# Patient Record
Sex: Male | Born: 1980 | Hispanic: Yes | Smoking: Never smoker
Health system: Southern US, Community
[De-identification: ages and names within clinical notes are randomized; demographics above are authoritative.]

## PROBLEM LIST (undated history)

## (undated) HISTORY — PX: NO PAST SURGERIES: SHX2092

---

## 2011-12-04 ENCOUNTER — Other Ambulatory Visit: Payer: Self-pay | Admitting: Geriatric Medicine

## 2011-12-04 DIAGNOSIS — G8929 Other chronic pain: Secondary | ICD-10-CM

## 2011-12-04 DIAGNOSIS — R109 Unspecified abdominal pain: Secondary | ICD-10-CM

## 2011-12-06 ENCOUNTER — Other Ambulatory Visit: Payer: Self-pay

## 2011-12-06 ENCOUNTER — Ambulatory Visit
Admission: RE | Admit: 2011-12-06 | Discharge: 2011-12-06 | Disposition: A | Discharge status: Home / Self Care | Payer: Self-pay | Source: Ambulatory Visit | Attending: Geriatric Medicine | Admitting: Geriatric Medicine

## 2011-12-06 DIAGNOSIS — R109 Unspecified abdominal pain: Secondary | ICD-10-CM

## 2011-12-06 DIAGNOSIS — R102 Pelvic and perineal pain: Secondary | ICD-10-CM

## 2011-12-06 DIAGNOSIS — G8929 Other chronic pain: Secondary | ICD-10-CM

## 2013-04-30 IMAGING — US US ABDOMEN COMPLETE
1 series · 14 of 25 positions shown · non-contrast
Comparison: None.

CLINICAL DATA: Chronic abdominal and pelvic pain for several years

COMPLETE ABDOMINAL ULTRASOUND

[Series 1: us abdomen complete · 0.28mm/px · 14 of 58 slices shown]
[im 1/58]
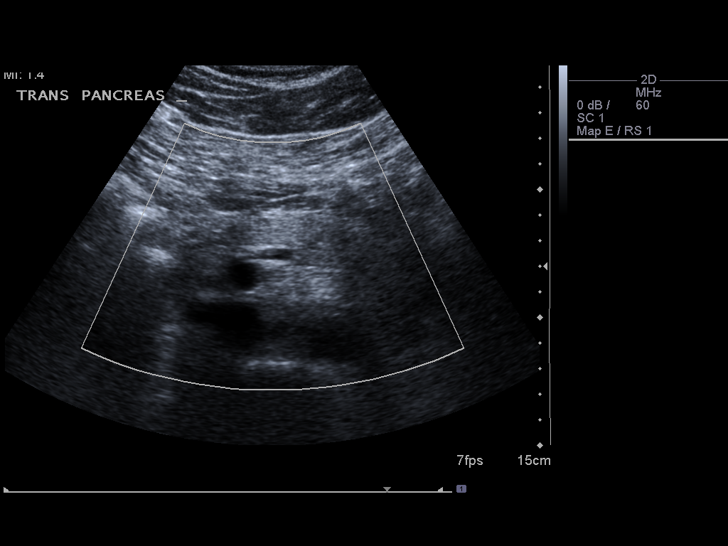
[im 5/58]
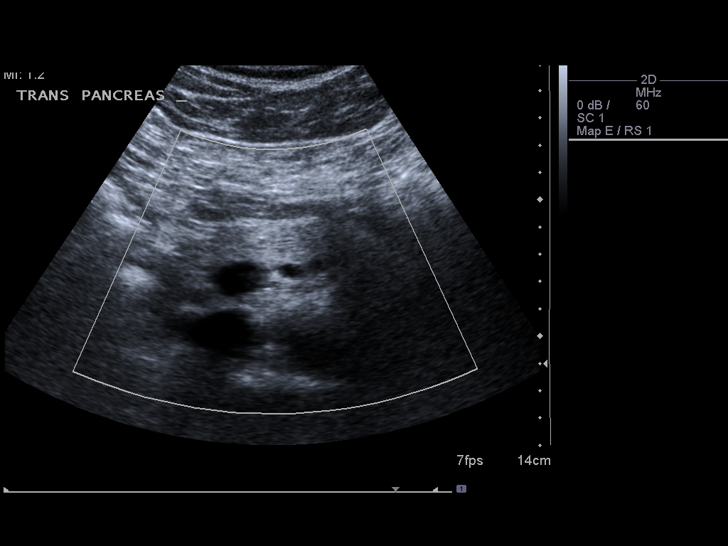
[im 10/58]
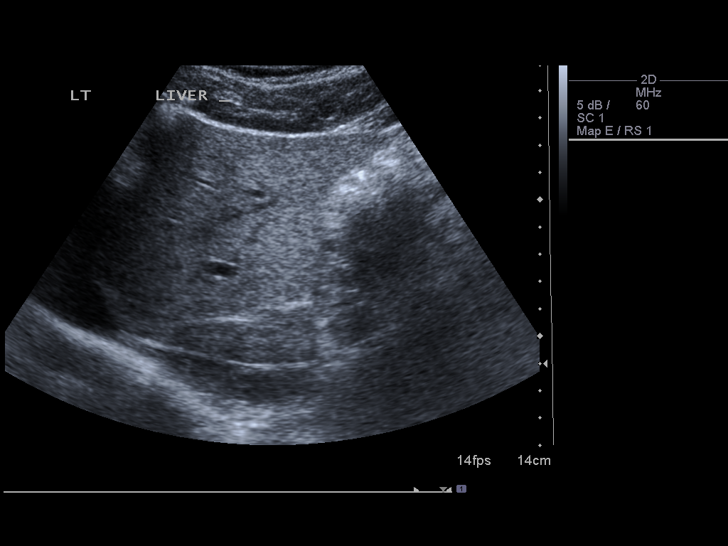
[im 15/58]
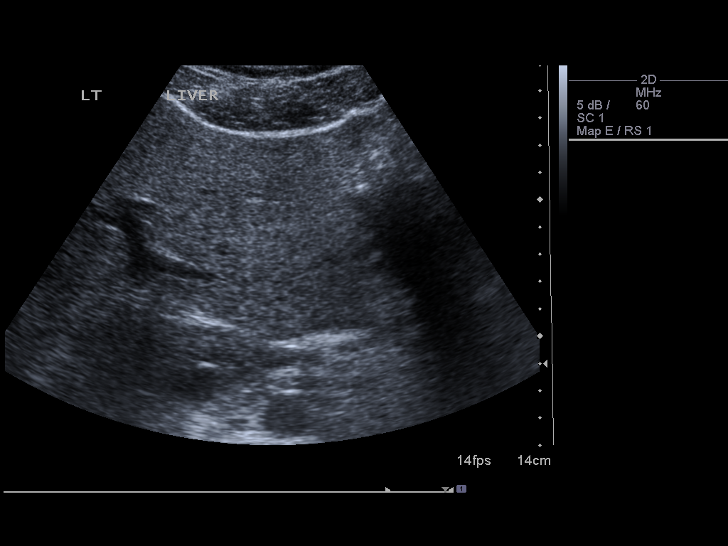
[im 20/58]
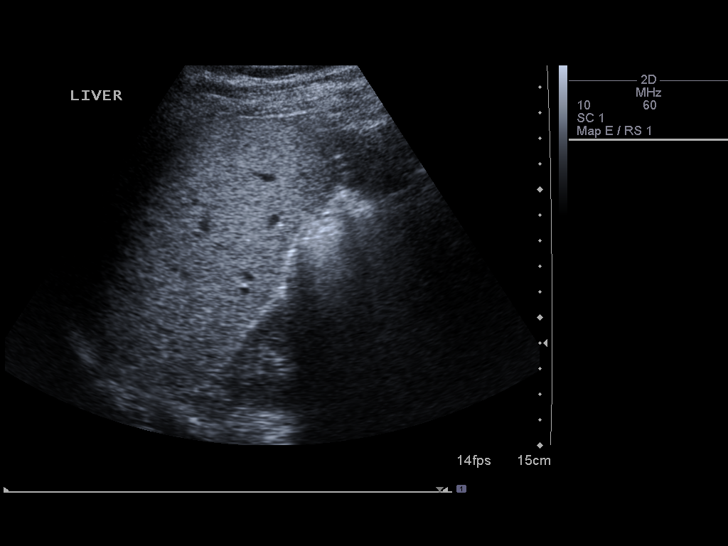
[im 22/58]
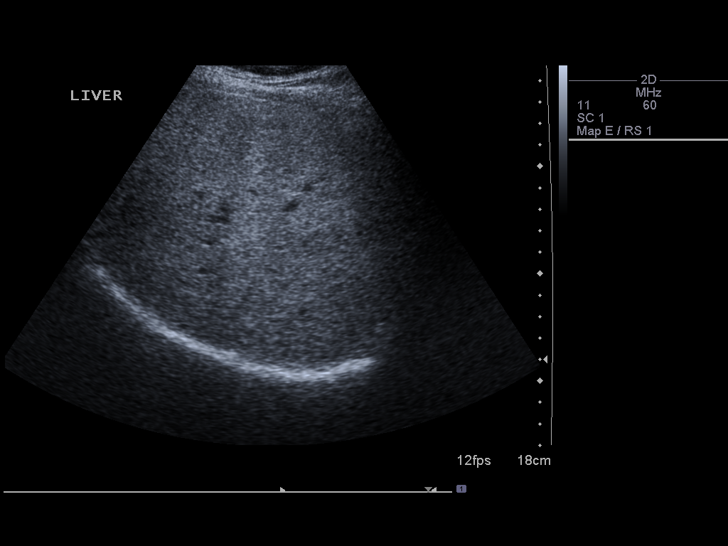
[im 27/58]
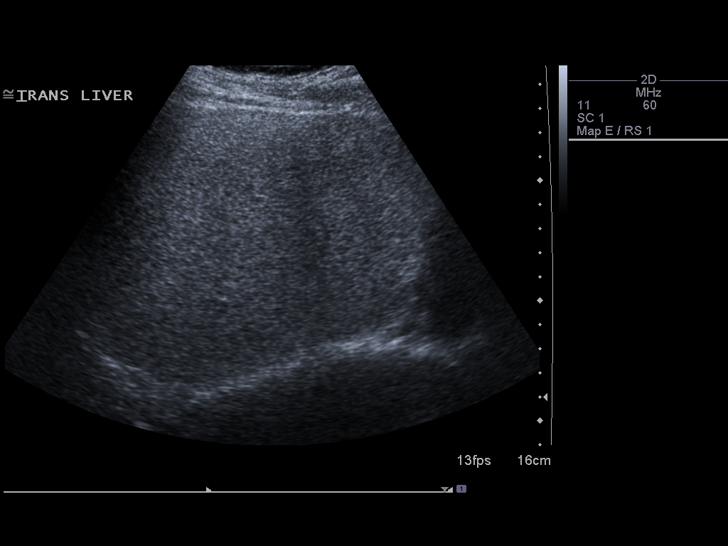
[im 31/58]
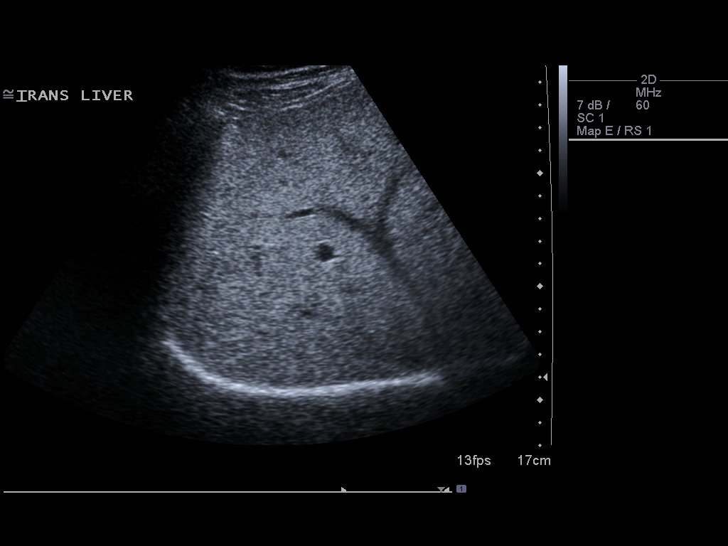
[im 36/58]
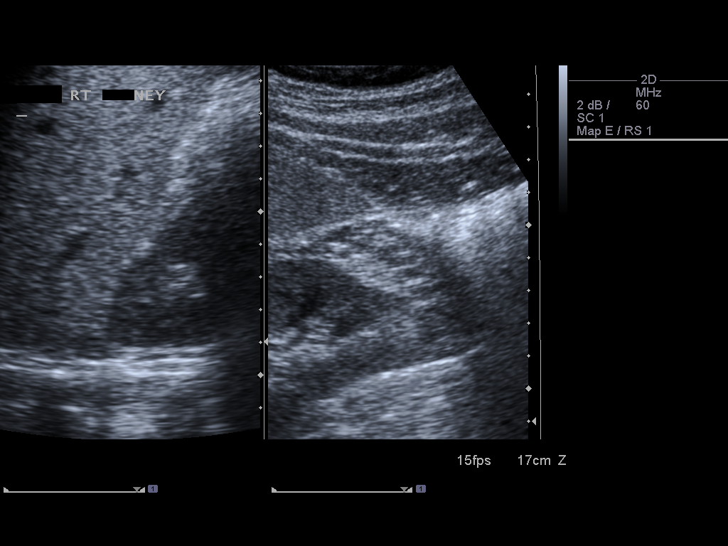
[im 39/58]
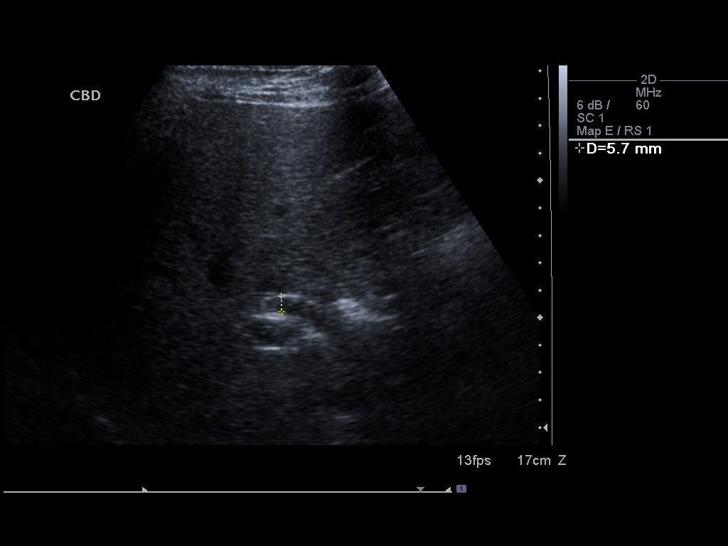
[im 43/58]
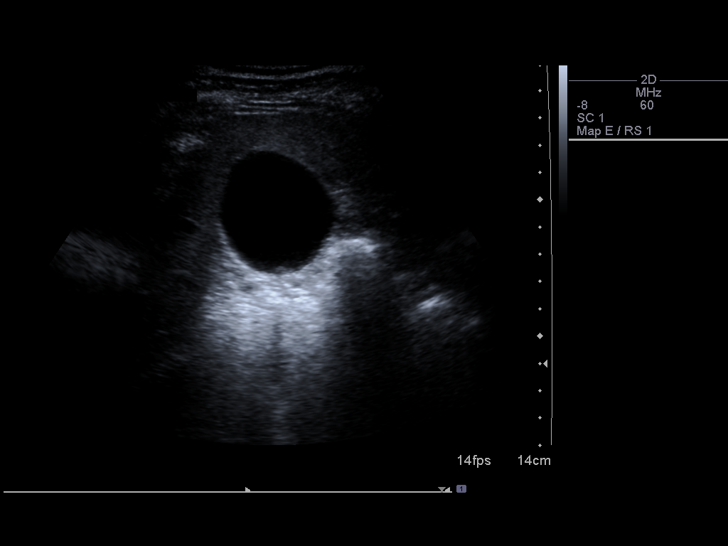
[im 48/58]
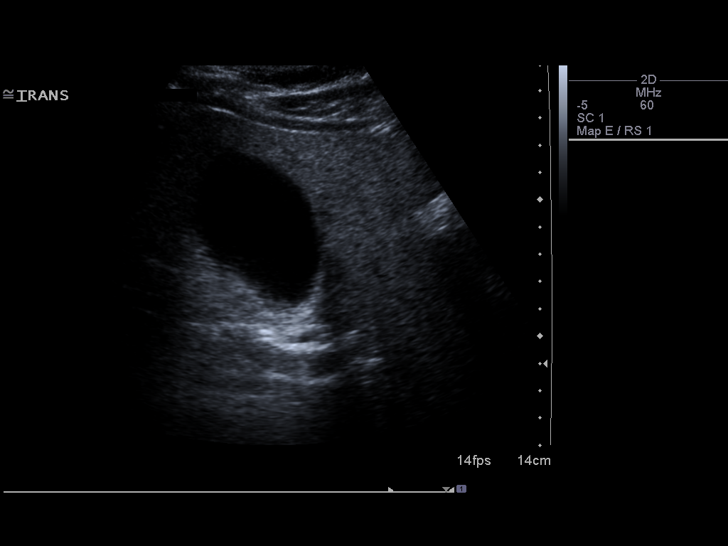
[im 53/58]
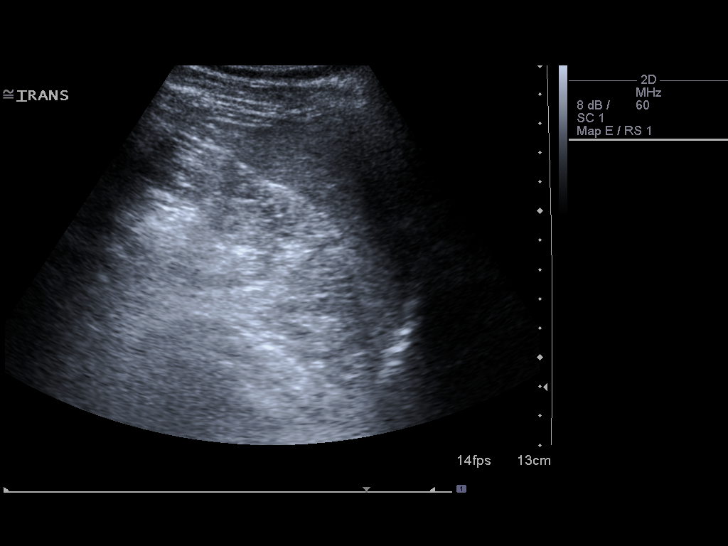
[im 58/58]
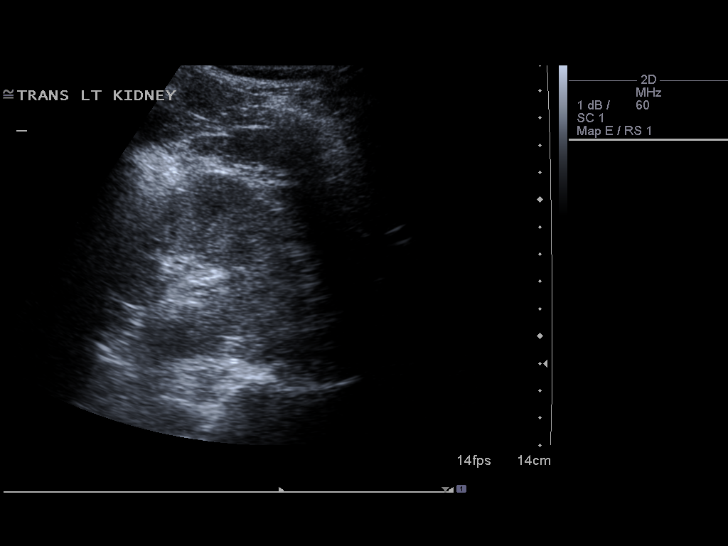

[14 of 25 positions shown; findings below may reference images not displayed]

FINDINGS: Gallbladder:  The gallbladder is visualized and no gallstones are
noted.  There is no pain over the gallbladder with compression.

Common bile duct:  The common bile duct is normal measuring

Liver:  The liver is diffusely echogenic consistent with fatty
infiltration.  No focal abnormality is seen.

IVC:  The IVC is unremarkable.

Pancreas:  The pancreas is moderately well seen with no abnormality
noted.  The pancreatic duct is not dilated.

Spleen:  The spleen is normal measuring 6.3 cm sagittally.

Right Kidney:  No hydronephrosis is seen.  The right kidney
measures 12.1 cm sagittally.

Left Kidney:  No hydronephrosis.  The left kidney measures 10.6 cm.

Abdominal aorta:  The abdominal aorta is normal in caliber.
IMPRESSION: 1.  Diffusely echogenic liver consistent with fatty infiltration.
No focal abnormality.
2.  No gallstones.

## 2020-08-09 ENCOUNTER — Encounter: Payer: Self-pay | Admitting: Nurse Practitioner

## 2020-08-12 ENCOUNTER — Other Ambulatory Visit (INDEPENDENT_AMBULATORY_CARE_PROVIDER_SITE_OTHER): Payer: Self-pay

## 2020-08-12 ENCOUNTER — Ambulatory Visit (INDEPENDENT_AMBULATORY_CARE_PROVIDER_SITE_OTHER): Payer: Self-pay | Admitting: Nurse Practitioner

## 2020-08-12 ENCOUNTER — Encounter: Payer: Self-pay | Admitting: Nurse Practitioner

## 2020-08-12 VITALS — BP 108/74 | HR 77 | Ht 67.0 in | Wt 187.0 lb

## 2020-08-12 DIAGNOSIS — R1033 Periumbilical pain: Secondary | ICD-10-CM

## 2020-08-12 DIAGNOSIS — R1084 Generalized abdominal pain: Secondary | ICD-10-CM | POA: Insufficient documentation

## 2020-08-12 DIAGNOSIS — R1030 Lower abdominal pain, unspecified: Secondary | ICD-10-CM

## 2020-08-12 DIAGNOSIS — R34 Anuria and oliguria: Secondary | ICD-10-CM

## 2020-08-12 DIAGNOSIS — K5900 Constipation, unspecified: Secondary | ICD-10-CM | POA: Insufficient documentation

## 2020-08-12 LAB — COMPREHENSIVE METABOLIC PANEL
ALT: 60 U/L — ABNORMAL HIGH (ref 0–53)
AST: 29 U/L (ref 0–37)
Albumin: 5 g/dL (ref 3.5–5.2)
Alkaline Phosphatase: 82 U/L (ref 39–117)
BUN: 16 mg/dL (ref 6–23)
CO2: 29 mEq/L (ref 19–32)
Calcium: 9.6 mg/dL (ref 8.4–10.5)
Chloride: 104 mEq/L (ref 96–112)
Creatinine, Ser: 1.09 mg/dL (ref 0.40–1.50)
GFR: 75.13 mL/min (ref >60.00)
Glucose, Bld: 87 mg/dL (ref 70–99)
Potassium: 4.1 mEq/L (ref 3.5–5.1)
Sodium: 138 mEq/L (ref 135–145)
Total Bilirubin: 0.3 mg/dL (ref 0.2–1.2)
Total Protein: 7.8 g/dL (ref 6.0–8.3)

## 2020-08-12 LAB — URINALYSIS
Bilirubin Urine: NEGATIVE
Hgb urine dipstick: NEGATIVE
Ketones, ur: NEGATIVE
Leukocytes,Ua: NEGATIVE
Nitrite: NEGATIVE
Specific Gravity, Urine: >=1.03 — AB (ref 1.000–1.030)
Total Protein, Urine: NEGATIVE
Urine Glucose: NEGATIVE
Urobilinogen, UA: 0.2 (ref 0.0–1.0)
pH: 5.5 (ref 5.0–8.0)

## 2020-08-12 LAB — CBC WITH DIFFERENTIAL/PLATELET
Basophils Absolute: 0 10*3/uL (ref 0.0–0.1)
Basophils Relative: 0.6 % (ref 0.0–3.0)
Eosinophils Absolute: 0.1 10*3/uL (ref 0.0–0.7)
Eosinophils Relative: 2.7 % (ref 0.0–5.0)
HCT: 41.6 % (ref 39.0–52.0)
Hemoglobin: 14.6 g/dL (ref 13.0–17.0)
Lymphocytes Relative: 46.4 % — ABNORMAL HIGH (ref 12.0–46.0)
Lymphs Abs: 2.4 10*3/uL (ref 0.7–4.0)
MCHC: 35.2 g/dL (ref 30.0–36.0)
MCV: 83.2 fl (ref 78.0–100.0)
Monocytes Absolute: 0.5 10*3/uL (ref 0.1–1.0)
Monocytes Relative: 9 % (ref 3.0–12.0)
Neutro Abs: 2.1 10*3/uL (ref 1.4–7.7)
Neutrophils Relative %: 41.3 % — ABNORMAL LOW (ref 43.0–77.0)
Platelets: 205 10*3/uL (ref 150.0–400.0)
RBC: 4.99 Mil/uL (ref 4.22–5.81)
RDW: 13.1 % (ref 11.5–15.5)
WBC: 5.1 10*3/uL (ref 4.0–10.5)

## 2020-08-12 NOTE — Progress Notes (Signed)
08/12/2020 Randall Mcknight [id number] 12-Jan-1981   CHIEF COMPLAINT: Constipation, abdominal pain   HISTORY OF PRESENT ILLNESS:  Randall Mcknight is a 39 year old male with a past medical history of  hypercholesterolemia and constipation. No past surgical history. He presents today for further evaluation for constipation and abdominal pain which have progressively worsened over the past year. He is accompanied by a [facility] interpreter as he speaks [place], limited [place].  He complains of having periumbilical/ lower abdominal pain which started about 1 year ago and has progressively worsened over the past 2 months.  He has associated constipation.  He describes passing a small amount of soft brown formed stool once or twice daily but does not feel emptied.  He denies having any rectal bleeding or melena.  He reports having intermittent hemorrhoidal irritation but no bleeding.  No rectal pain.  He has difficulty urinating.  He describes feeling pressure in his bladder and his urine flow has been decreased for several years.  After he passes a bowel movement he urinates a small amount with dysuria.  No hematuria.  He stated his abdominal pain is constant but does not awaken him from sleep.  However, he has anxiety and worries significantly regarding the cause of abdominal pain which interferes with his ability to sleep well.  No fever, sweats or chills.  No weight loss.  No nausea or vomiting.  No dysphagia or heartburn.  Records from his document he underwent an EGD 06/23/2013 which was normal.  Negative H. pylori stool antigen 08/2016.  He was seen by his PCP and he was prescribed MiraLAX which he took for 1 week without significant improvement.  He also tried Dicyclomine 20 mg p.o. as needed which did not relieve his abdominal pain.  No family history of esophageal, gastric, colon cancer or IBD.  He is currently uninsured.  We have provided the patient with [employer] financial assistant  forms.   Social History: He is single.  He has 1 son and 2 daughters.  He works in [employer].  He is a non-smoker.  No alcohol use.  No drug use.  Family History:  Mother age 75 healthy. Father 29 healthy. No family history of esophageal, gastric or colon cancer.  Grandparents with diabetes.  No Known Allergies    No outpatient encounter medications on file as of 08/12/2020.   No facility-administered encounter medications on file as of 08/12/2020.     REVIEW OF SYSTEMS:  Gen: Denies fever, sweats or chills. No weight loss.  CV: Denies chest pain, palpitations or edema. Resp: Denies cough, shortness of breath of hemoptysis.  GI: See HPI. GU : See HPI. MS: Denies joint pain, muscles aches or weakness. Derm: Denies rash, itchiness, skin lesions or unhealing ulcers. Psych: Denies depression, anxiety or memory loss.  Heme: Denies bruising, bleeding. Neuro:  Denies headaches, dizziness or paresthesias. Endo:  Denies any problems with DM, thyroid or adrenal function.    PHYSICAL EXAM: BP 108/74   Pulse 77   Ht 5\' 7"  (1.702 m)   Wt 187 lb (84.8 kg)   BMI 29.29 kg/m  General: Well developed  39 year old male in no acute distress. Head: Normocephalic and atraumatic. Eyes:  Sclerae non-icteric, conjunctive pink. Ears: Normal auditory acuity. Mouth: Dentition intact. No ulcers or lesions.  Neck: Supple, no lymphadenopathy or thyromegaly.  Lungs: Clear bilaterally to auscultation without wheezes, crackles or rhonchi. Heart: Regular rate and rhythm. No murmur, rub or gallop appreciated.  Abdomen: Soft, non distended.  Deep periumbilical and lower abdominal tenderness without rebound or guarding.  No masses. No hepatosplenomegaly. Normoactive bowel sounds x 4 quadrants.  Rectal: Deferred. Musculoskeletal: Symmetrical with no gross deformities. Skin: Warm and dry. No rash or lesions on visible extremities. Extremities: No edema. Neurological: Alert oriented x 4, no focal  deficits.  Psychological:  Alert and cooperative. Normal mood and affect.  ASSESSMENT AND PLAN:  26.  39 year old male with periumbilical/lower abdominal pain for the past year which is progressively worsened over the past 2 months. -CBC, CMP and urinalysis -Abdominal/pelvic CT with oral and IV contrast -MiraLAX twice daily.  May add Duca locks 1-2 tabs every third nights as needed/if tolerated -Patient to call our office if his symptoms worsen -Patient to present to the emergency room if severe abdominal pain develops -Recommended urology evaluation -He will most likely require a colonoscopy after financial assistance orange card received -Further recommendations will be determined after the above lab and CT results received  Patient is currently uninsured.  He intends to apply for [facility] health financial assistance.  He elects to proceed with the above laboratory and CTAP orders which he acknowledges will be out of pocket cost.    CC:  Missy Sabins, Malia A, PA-C

## 2020-08-12 NOTE — Patient Instructions (Signed)
If you are age 39 or older, your body mass index should be between 23-30. Your Body mass index is 29.29 kg/m. If this is out of the aforementioned range listed, please consider follow up with your Primary Care Provider.  If you are age 19 or younger, your body mass index should be between 19-25. Your Body mass index is 29.29 kg/m. If this is out of the aformentioned range listed, please consider follow up with your Primary Care Provider.  _____________________________________________________________________________________________ Your provider has requested that you go to the basement level for lab work before leaving today. Press "B" on the elevator. The lab is located at the first door on the left as you exit the elevator. ____________________________________________________________________________________________   Randall Mcknight have been scheduled for a CT scan of the abdomen and pelvis at [facility], 1st floor Radiology. You are scheduled on 08/20/2020  at 3:30am. You should arrive 15 minutes prior to your appointment time for registration.  Please pick up 2 bottles of contrast from [facility] at least 3 days prior to your scan. The solution may taste better if refrigerated, but do NOT add ice or any other liquid to this solution. Shake well before drinking.   Please follow the written instructions below on the day of your exam:   1) Do not eat anything after 11:30am (4 hours prior to your test)   2) Drink 1 bottle of contrast @ 1:30am (2 hours prior to your exam)  Remember to shake well before drinking and do NOT pour over ice.     Drink 1 bottle of contrast @ 2:30am (1 hour prior to your exam)   You may take any medications as prescribed with a small amount of water, if necessary. If you take any of the following medications: METFORMIN, GLUCOPHAGE, GLUCOVANCE, AVANDAMET, RIOMET, FORTAMET, [place] MET, JANUMET, GLUMETZA or METAGLIP, you MAY be asked to HOLD this medication 48 hours AFTER  the exam.   The purpose of you drinking the oral contrast is to aid in the visualization of your intestinal tract. The contrast solution may cause some diarrhea. Depending on your individual set of symptoms, you may also receive an intravenous injection of x-ray contrast/dye. Plan on being at [facility] for 45 minutes or longer, depending on the type of exam you are having performed.   If you have any questions regarding your exam or if you need to reschedule, you may call [facility] at [phone] between the hours of 8:00 am and 5:00 pm, Monday-Friday.   _______________________________________________________________________________________________   1. Take Miralax 1 capful mixed in 8 ounces of water at bed time for constipation as tolerated. 2. Ducolax 52m 1 to 2 tablets by mouth every three days as needed. 3. Drink 8 (8 0unce) glasses of water daily. 4. Follow up in office in 3-4 weeks. 5. To ED if severe abdominal pain  Due to recent changes in healthcare laws, you may see the results of your imaging and laboratory studies on MyChart before your provider has had a chance to review them.  We understand that in some cases there may be results that are confusing or concerning to you. Not all laboratory results come back in the same time frame and the provider may be waiting for multiple results in order to interpret others.  Please give [id number] hours in order for your provider to thoroughly review all the results before contacting the office for clarification of your results.   Thank you for choosing [facility]  Noralyn Pick, CRNP  [phone]

## 2020-08-16 NOTE — Progress Notes (Signed)
Agree with the assessment and plan as outlined by Colleen Kennedy-Smith, NP.   Suhana Wilner, DO, FACG [facility]   

## 2020-08-18 ENCOUNTER — Telehealth: Payer: Self-pay | Admitting: General Surgery

## 2020-08-18 ENCOUNTER — Other Ambulatory Visit: Payer: Self-pay | Admitting: General Surgery

## 2020-08-18 DIAGNOSIS — R748 Abnormal levels of other serum enzymes: Secondary | ICD-10-CM

## 2020-08-18 NOTE — Telephone Encounter (Signed)
-----   Message from Arnaldo Natal, NP sent at 08/18/2020  5:49 AM EDT ----- [name], pls have spanish speaking staff contact patient and let him know his Alt liver enzyme is a little elevated. Provide him with a lab order to check a hepatic panel in 4 weeks. If his liver test remains abnormal he will need additional liver tests.   His urine shows he is a little dehydrated, patient to increase his water intake to 8 glasses (64 ounces daily). Thx

## 2020-08-18 NOTE — Telephone Encounter (Signed)
Patient returned the call provided him with the recent lab results and Colleen's recommendations. Patient understood and is aware of upcoming CT appt\instructions and lab order in Epic to be done anytime after 09/09/2020. Patient had no further questions at this time.

## 2020-08-18 NOTE — Telephone Encounter (Signed)
Called patient to provide lab results per Latricia Heft left voicemail.

## 2020-08-20 ENCOUNTER — Other Ambulatory Visit: Payer: Self-pay

## 2020-08-20 ENCOUNTER — Ambulatory Visit (HOSPITAL_COMMUNITY)
Admission: RE | Admit: 2020-08-20 | Discharge: 2020-08-20 | Disposition: A | Discharge status: Home / Self Care | Payer: Self-pay | Source: Ambulatory Visit | Attending: Nurse Practitioner | Admitting: Nurse Practitioner

## 2020-08-20 DIAGNOSIS — R34 Anuria and oliguria: Secondary | ICD-10-CM | POA: Insufficient documentation

## 2020-08-20 DIAGNOSIS — K5900 Constipation, unspecified: Secondary | ICD-10-CM | POA: Insufficient documentation

## 2020-08-20 DIAGNOSIS — R1033 Periumbilical pain: Secondary | ICD-10-CM | POA: Insufficient documentation

## 2020-08-20 DIAGNOSIS — R1030 Lower abdominal pain, unspecified: Secondary | ICD-10-CM | POA: Insufficient documentation

## 2020-08-20 MED ORDER — IOHEXOL 300 MG/ML  SOLN
100.0000 mL | Freq: Once | INTRAMUSCULAR | Status: AC | PRN
  Administered 2020-08-20: 100 mL via INTRAVENOUS

## 2020-08-23 NOTE — Telephone Encounter (Addendum)
Called patient to provide CT results left voicemail

## 2020-08-24 ENCOUNTER — Telehealth: Payer: Self-pay | Admitting: General Surgery

## 2020-08-24 NOTE — Telephone Encounter (Signed)
See telephone encounter- Serina Cowper spoke with the patient

## 2020-08-24 NOTE — Telephone Encounter (Signed)
-----   Message from Arnaldo Natal, NP sent at 08/23/2020  5:50 AM EDT ----- [name], pls have spanish speaking staff contact patient and let him know his abd/pelvic CT was norma. thx

## 2020-09-08 ENCOUNTER — Telehealth: Payer: Self-pay | Admitting: Nurse Practitioner

## 2020-09-08 NOTE — Telephone Encounter (Signed)
Randall Mcknight came by the office after 5pm yesterday 09-07-20 to drop off forms for Financial Assistance. I copied all of his pay stubs and tax forms he brought. This information should not have been brought here but direct to the address on the bottom of the application forms, but we have forwarded to that office on his behalf. I did provide him with a copy of this form for his records. I promised to call him today to advise that forms were sent on his behalf. He did not answer, I left him a voicemail to call back if he had any further questions. If he calls back please advice him that the office closes at 5 pm and it was fortunate I happened to be at the front desk at that time.

## 2020-09-09 ENCOUNTER — Telehealth: Payer: Self-pay | Admitting: Nurse Practitioner

## 2020-09-09 NOTE — Telephone Encounter (Signed)
Left msg on pt's vm to call back with lab results.   Per Alcide EvenerFrench Ana, pls have spanish speaking staff contact patient and let him know his Alt liver enzyme is a little elevated. Provide him with a lab order to check a hepatic panel in 4 weeks. If his liver test remains abnormal he will need additional liver tests.   His urine shows he is a little dehydrated, patient to increase his water intake to 8 glasses (64 ounces daily). Thx

## 2022-01-13 IMAGING — CT CT ABD-PELV W/ CM
2 of 4 series · 16 of 46 positions shown, 18 images · IV contrast (APPLIED)
Comparison: None.

CLINICAL DATA: Worsening lower abdominal pain, nausea, and
constipation for several years.

EXAM:
CT ABDOMEN AND PELVIS WITH CONTRAST
TECHNIQUE: Multidetector CT imaging of the abdomen and pelvis was performed
using the standard protocol following bolus administration of
intravenous contrast.
CONTRAST:  100mL OMNIPAQUE IOHEXOL 300 MG/ML  SOLN

[Series 2: axial st · axial · 0.72mm/px · z∈[-759,-294]mm · 13 of 103 slices shown, 15 images]
[im 5/103  soft-tissue]
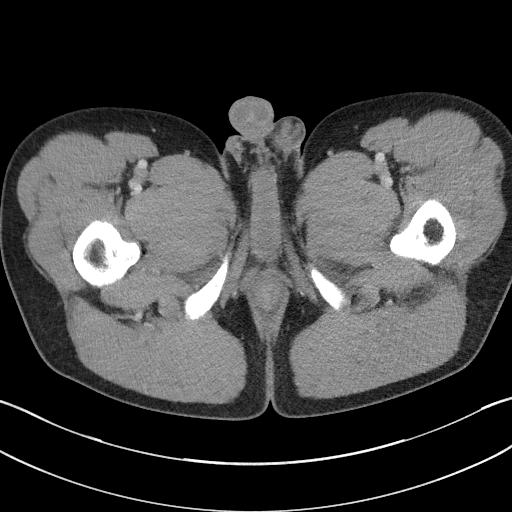
[im 5/103  bone]
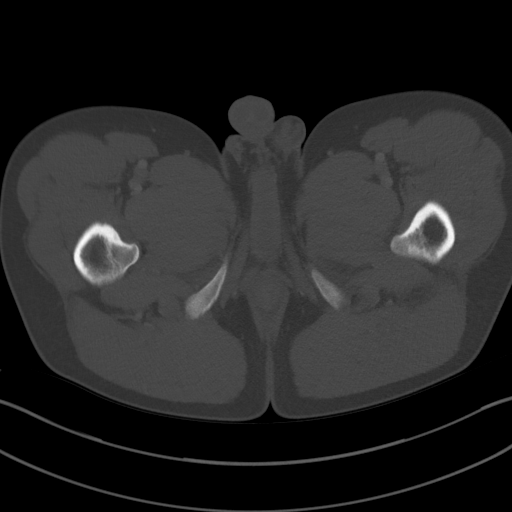
[im 15/103  soft-tissue]
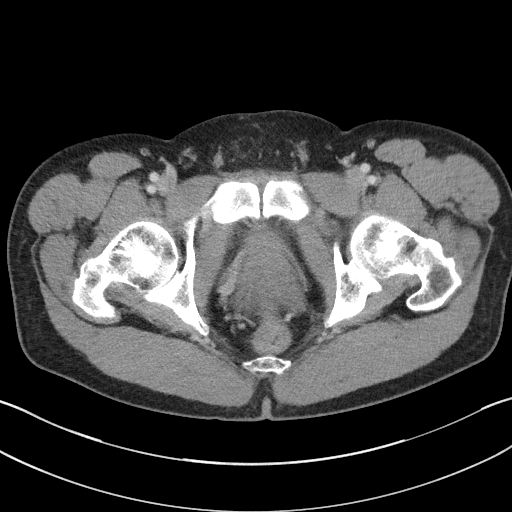
[im 20/103  soft-tissue]
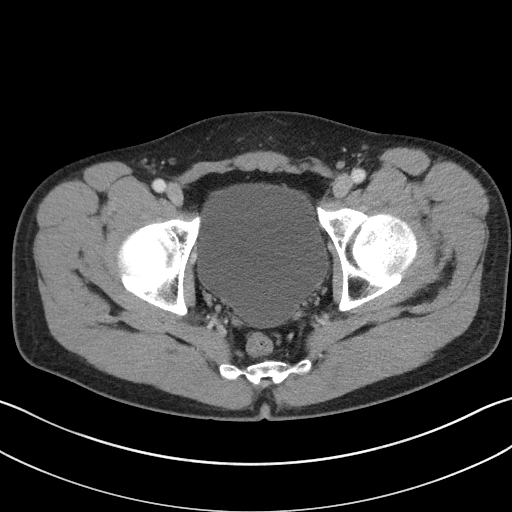
[im 30/103  soft-tissue]
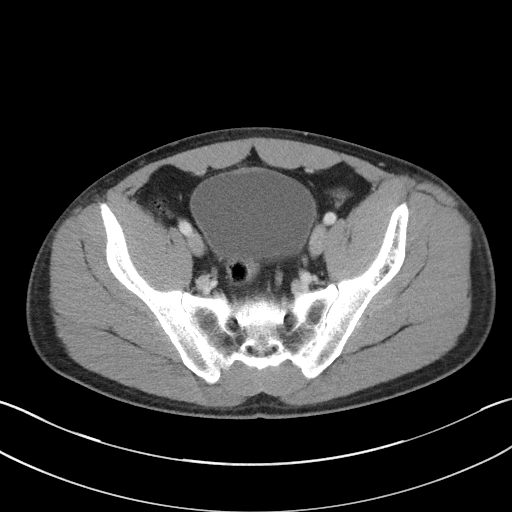
[im 35/103  soft-tissue]
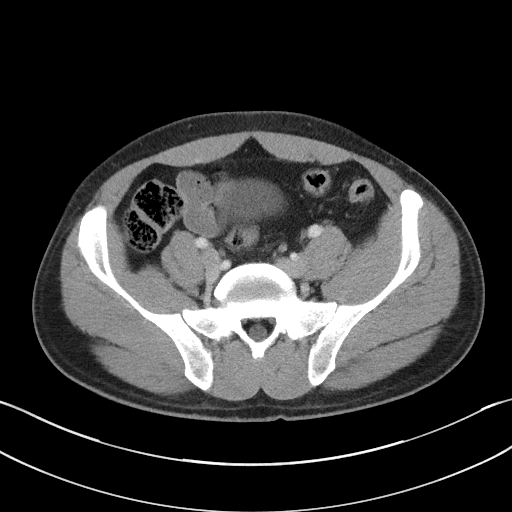
[im 44/103  soft-tissue]
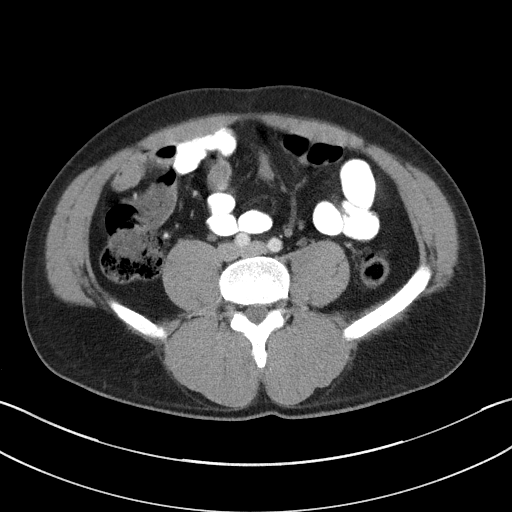
[im 54/103  soft-tissue]
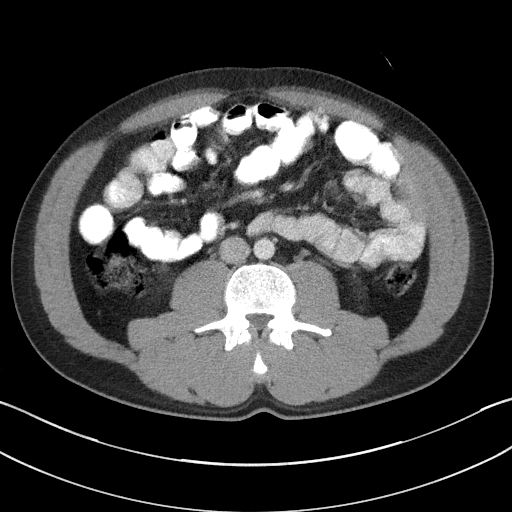
[im 59/103  soft-tissue]
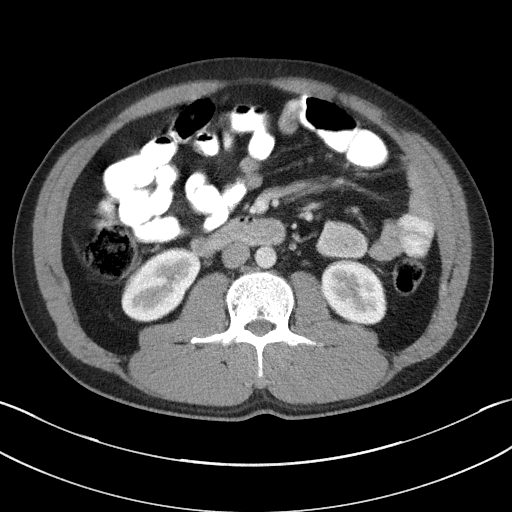
[im 69/103  soft-tissue]
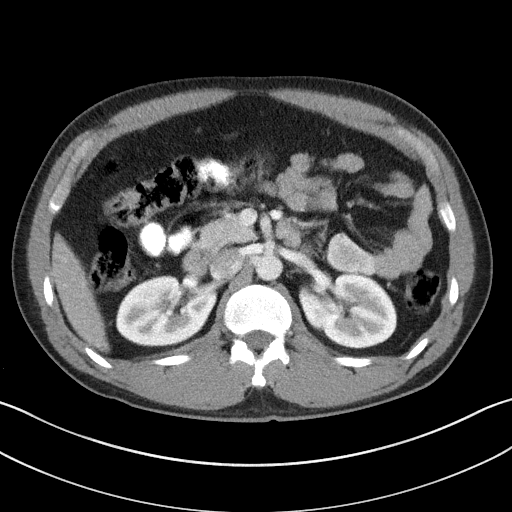
[im 69/103  bone]
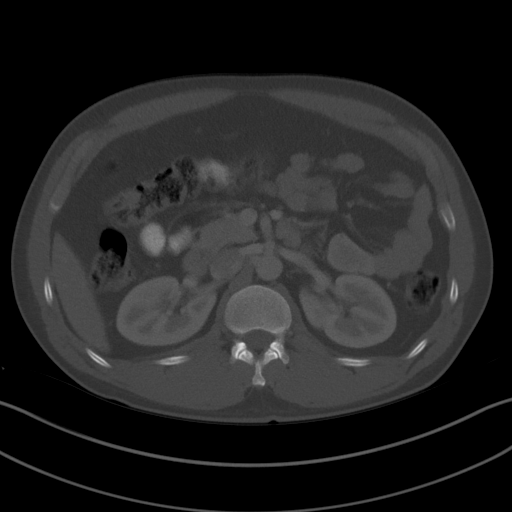
[im 73/103  soft-tissue]
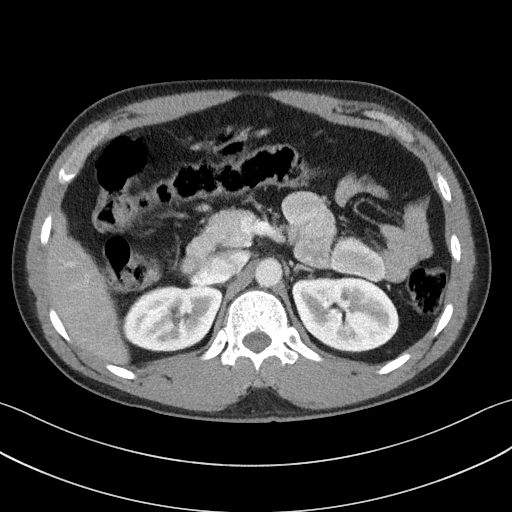
[im 83/103  soft-tissue]
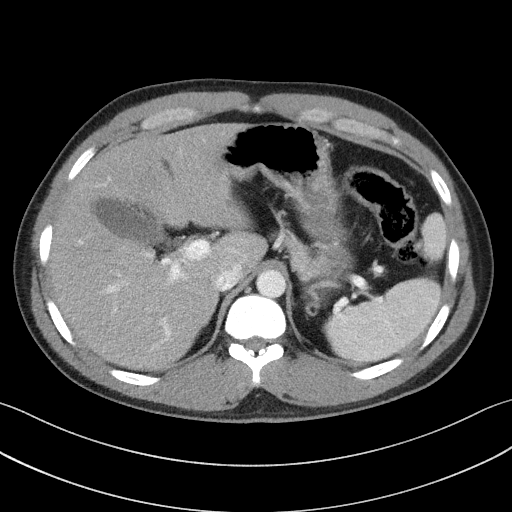
[im 88/103  soft-tissue]
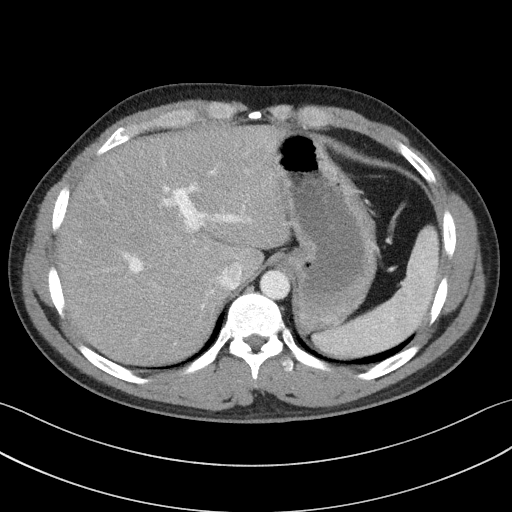
[im 98/103  soft-tissue]
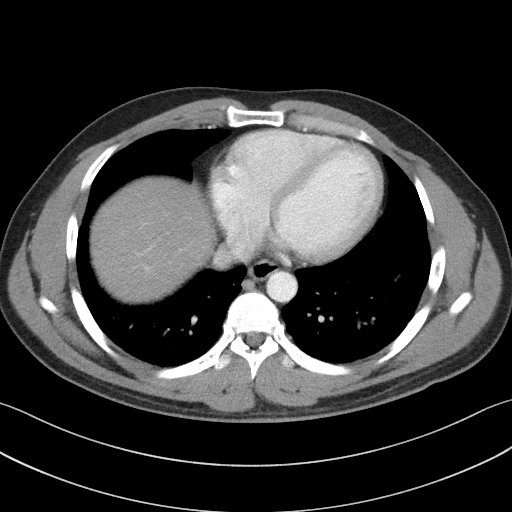

[Series 4: coronal st · coronal · 0.70mm/px · 3 of 90 slices shown]
[im 30/90  soft-tissue]
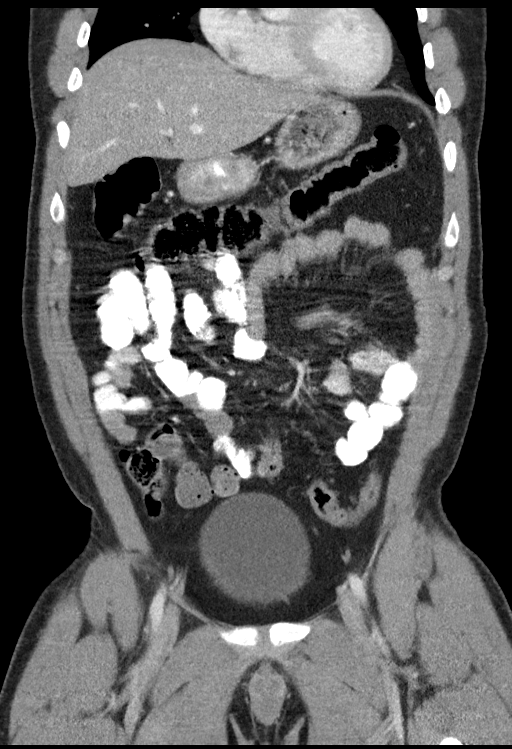
[im 40/90  soft-tissue]
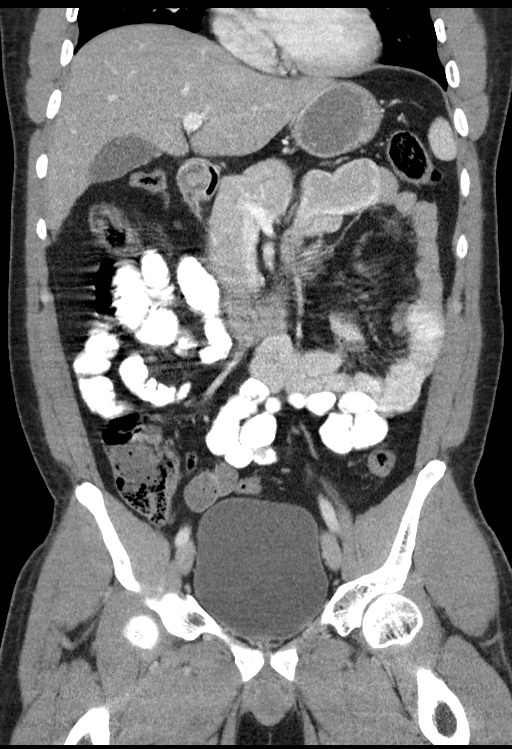
[im 50/90  soft-tissue]
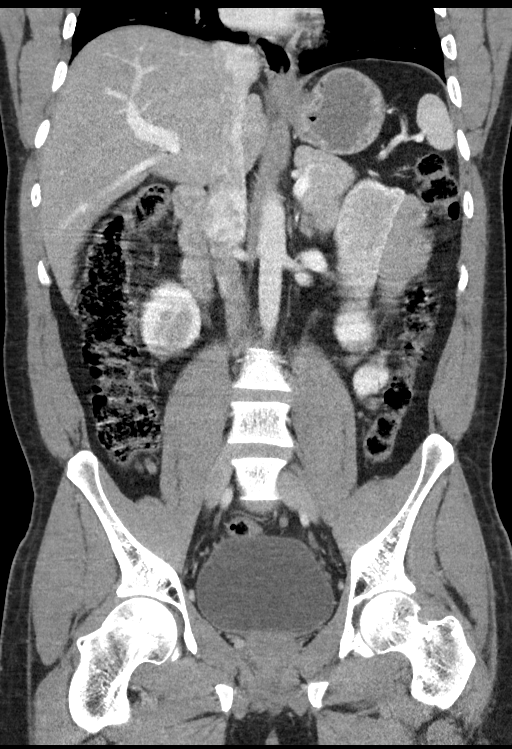

[16 of 46 positions shown; findings below may reference images not displayed]

FINDINGS: Lower Chest: No acute findings.

Hepatobiliary: No hepatic masses identified. Gallbladder is
unremarkable. No evidence of biliary ductal dilatation.

Pancreas:  No mass or inflammatory changes.

Spleen: Within normal limits in size and appearance.

Adrenals/Urinary Tract: No masses identified. No evidence of
ureteral calculi or hydronephrosis.

Stomach/Bowel: No evidence of obstruction, inflammatory process or
abnormal fluid collections. Normal appendix visualized.

Vascular/Lymphatic: No pathologically enlarged lymph nodes. No
abdominal aortic aneurysm.

Reproductive:  No mass or other significant abnormality.

Other:  None.

Musculoskeletal:  No suspicious bone lesions identified.
IMPRESSION: Negative. No acute findings or other significant abnormality.
